# Patient Record
Sex: Male | Born: 1950
Health system: Southern US, Community
[De-identification: ages and names within clinical notes are randomized; demographics above are authoritative.]

## PROBLEM LIST (undated history)

## (undated) DIAGNOSIS — E7800 Pure hypercholesterolemia, unspecified: Secondary | ICD-10-CM

## (undated) DIAGNOSIS — E079 Disorder of thyroid, unspecified: Secondary | ICD-10-CM

## (undated) DIAGNOSIS — I10 Essential (primary) hypertension: Secondary | ICD-10-CM

## (undated) DIAGNOSIS — I2510 Atherosclerotic heart disease of native coronary artery without angina pectoris: Secondary | ICD-10-CM

## (undated) HISTORY — PX: CYST EXCISION: SHX5701

## (undated) HISTORY — PX: BIOPSY THYROID: PRO38

## (undated) HISTORY — PX: WISDOM TOOTH EXTRACTION: SHX21

## (undated) HISTORY — PX: OTHER SURGICAL HISTORY: SHX169

---

## 2002-06-05 ENCOUNTER — Ambulatory Visit (HOSPITAL_BASED_OUTPATIENT_CLINIC_OR_DEPARTMENT_OTHER): Admission: RE | Admit: 2002-06-05 | Discharge: 2002-06-05 | Payer: Self-pay | Admitting: General Surgery

## 2002-06-05 ENCOUNTER — Encounter (INDEPENDENT_AMBULATORY_CARE_PROVIDER_SITE_OTHER): Payer: Self-pay | Admitting: Specialist

## 2005-09-29 ENCOUNTER — Encounter: Admission: RE | Admit: 2005-09-29 | Discharge: 2005-09-29 | Payer: Self-pay | Admitting: Internal Medicine

## 2005-10-31 ENCOUNTER — Ambulatory Visit (HOSPITAL_BASED_OUTPATIENT_CLINIC_OR_DEPARTMENT_OTHER): Admission: RE | Admit: 2005-10-31 | Discharge: 2005-10-31 | Payer: Self-pay | Admitting: Plastic Surgery

## 2005-10-31 ENCOUNTER — Encounter (INDEPENDENT_AMBULATORY_CARE_PROVIDER_SITE_OTHER): Payer: Self-pay | Admitting: *Deleted

## 2006-10-02 ENCOUNTER — Encounter: Admission: RE | Admit: 2006-10-02 | Discharge: 2006-10-02 | Payer: Self-pay | Admitting: Internal Medicine

## 2008-10-05 ENCOUNTER — Encounter: Admission: RE | Admit: 2008-10-05 | Discharge: 2008-10-05 | Payer: Self-pay | Admitting: Internal Medicine

## 2009-12-13 ENCOUNTER — Encounter: Admission: RE | Admit: 2009-12-13 | Discharge: 2009-12-13 | Payer: Self-pay | Admitting: Endocrinology

## 2010-12-30 NOTE — Op Note (Signed)
   NAME:  Chase Sullivan, Chase Sullivan                         ACCOUNT NO.:  [id number]   MEDICAL RECORD NO.:  [id number]                   PATIENT TYPE:  AMB   LOCATION:  DSC                                  FACILITY:  [facility]   PHYSICIAN:  Rose Phi. Maple Hudson, M.D.                DATE OF BIRTH:  May 19, 1951   DATE OF PROCEDURE:  06/05/2002  DATE OF DISCHARGE:                                 OPERATIVE REPORT   PREOPERATIVE DIAGNOSIS:  Two large sebaceous cysts of his back.   POSTOPERATIVE DIAGNOSIS:  Two large sebaceous cysts of his back.   PROCEDURE:  Excision of two large sebaceous cysts of his back.   SURGEON:  Rose Phi. Maple Hudson, M.D.   ANESTHESIA:  MAC.   DESCRIPTION OF PROCEDURE:  Patient placed in the prone position and the back  prepped and draped in the usual fashion.  One cyst measured about 5 cm in  diameter and the other about 2 cm.  After making vertical incisions and  prepping and draping, we thoroughly infiltrated the area with 1% Xylocaine  with adrenalin.  Incisions were made, and both cysts were excised intact.  The larger incision left a defect of about 5 x 2 cm and the smaller one  about 2 x 1 cm.  They were both closed with interrupted nylon sutures.  Dressings applied.  The patient transferred to the recovery room in  satisfactory condition, having tolerated the procedure well.                                               Rose Phi. Maple Hudson, M.D.    PRY/MEDQ  D:  06/05/2002  T:  06/05/2002  Job:  [id number]

## 2010-12-30 NOTE — Op Note (Signed)
NAME:  Chase Sullivan, Chase Sullivan NO.:  [id number]   MEDICAL RECORD NO.:  [id number]          PATIENT TYPE:  AMB   LOCATION:  DSC                          FACILITY:  [facility]   PHYSICIAN:  Etter Sjogren, M.D.     DATE OF BIRTH:  [birth year]   DATE OF PROCEDURE:  10/31/2005  DATE OF DISCHARGE:                                 OPERATIVE REPORT   PREOPERATIVE DIAGNOSIS:  Lesion, left lower eyelid, undetermined behavior.   POSTOPERATIVE DIAGNOSIS:  Lesion, left lower eyelid, undetermined behavior.   OPERATION PERFORMED:  Excision lesion, left lower eyelid.   SURGEON:  Etter Sjogren, M.D.   ANESTHESIA:  MAC with local 1% Xylocaine with epinephrine plus bicarb.   INDICATIONS FOR PROCEDURE:  The patient is a 59 year old man with lesion  left lower eyelid.  He is referred by his internist for evaluation and  excision.  Ashby Dawes of the procedure and the risks were discussed with the  patient.  He was very nervous and preferred to use sedation anesthetic for  this.  He understood the procedure, scarring and wished to proceed.   DESCRIPTION OF PROCEDURE:  The patient was taken to the operating room and  placed supine.  He was prepped with Betadine and draped with sterile drapes.  The lesion was marked for an elliptical  excision and satisfactory local  anesthesia was achieved.  The excision was performed.  The wound irrigated  thoroughly and meticulous hemostasis electrocautery.  Closure with 6-0  Prolene simple running suture.  Tolerated well.   DISPOSITION:  Recheck in the office next week.      Etter Sjogren, M.D.  Electronically Signed     DB/MEDQ  D:  10/31/2005  T:  11/01/2005  Job:  [id number]

## 2012-02-27 ENCOUNTER — Other Ambulatory Visit: Payer: Self-pay | Admitting: Internal Medicine

## 2012-02-27 DIAGNOSIS — E049 Nontoxic goiter, unspecified: Secondary | ICD-10-CM

## 2012-03-04 ENCOUNTER — Other Ambulatory Visit: Payer: Self-pay

## 2012-04-01 ENCOUNTER — Ambulatory Visit
Admission: RE | Admit: 2012-04-01 | Discharge: 2012-04-01 | Disposition: A | Discharge status: Home / Self Care | Payer: BC Managed Care – PPO | Source: Ambulatory Visit | Attending: Internal Medicine | Admitting: Internal Medicine

## 2012-04-01 ENCOUNTER — Inpatient Hospital Stay: Admission: RE | Admit: 2012-04-01 | Payer: Self-pay | Source: Ambulatory Visit

## 2012-04-01 DIAGNOSIS — E049 Nontoxic goiter, unspecified: Secondary | ICD-10-CM

## 2012-10-23 ENCOUNTER — Other Ambulatory Visit: Payer: Self-pay | Admitting: Endocrinology

## 2013-02-24 ENCOUNTER — Ambulatory Visit
Admission: RE | Admit: 2013-02-24 | Discharge: 2013-02-24 | Disposition: A | Discharge status: Home / Self Care | Payer: BC Managed Care – PPO | Source: Ambulatory Visit | Attending: Endocrinology | Admitting: Endocrinology

## 2013-02-24 DIAGNOSIS — E049 Nontoxic goiter, unspecified: Secondary | ICD-10-CM

## 2014-04-28 ENCOUNTER — Other Ambulatory Visit: Payer: Self-pay | Admitting: Endocrinology

## 2014-04-28 DIAGNOSIS — E049 Nontoxic goiter, unspecified: Secondary | ICD-10-CM

## 2014-05-06 ENCOUNTER — Ambulatory Visit
Admission: RE | Admit: 2014-05-06 | Discharge: 2014-05-06 | Disposition: A | Discharge status: Home / Self Care | Payer: BC Managed Care – PPO | Source: Ambulatory Visit | Attending: Endocrinology | Admitting: Endocrinology

## 2014-05-06 DIAGNOSIS — E049 Nontoxic goiter, unspecified: Secondary | ICD-10-CM

## 2015-04-26 ENCOUNTER — Other Ambulatory Visit: Payer: Self-pay | Admitting: Endocrinology

## 2015-04-26 DIAGNOSIS — E049 Nontoxic goiter, unspecified: Secondary | ICD-10-CM

## 2016-04-26 ENCOUNTER — Ambulatory Visit
Admission: RE | Admit: 2016-04-26 | Discharge: 2016-04-26 | Disposition: A | Discharge status: Home / Self Care | Payer: Self-pay | Source: Ambulatory Visit | Attending: Endocrinology | Admitting: Endocrinology

## 2016-04-26 DIAGNOSIS — E049 Nontoxic goiter, unspecified: Secondary | ICD-10-CM

## 2017-03-18 ENCOUNTER — Other Ambulatory Visit: Payer: Self-pay

## 2017-03-18 ENCOUNTER — Emergency Department (HOSPITAL_BASED_OUTPATIENT_CLINIC_OR_DEPARTMENT_OTHER)
Admission: EM | Admit: 2017-03-18 | Discharge: 2017-03-18 | Disposition: A | Discharge status: Home / Self Care | Payer: BLUE CROSS/BLUE SHIELD | Attending: Emergency Medicine | Admitting: Emergency Medicine

## 2017-03-18 ENCOUNTER — Encounter (HOSPITAL_BASED_OUTPATIENT_CLINIC_OR_DEPARTMENT_OTHER): Payer: Self-pay | Admitting: *Deleted

## 2017-03-18 DIAGNOSIS — I10 Essential (primary) hypertension: Secondary | ICD-10-CM | POA: Diagnosis not present

## 2017-03-18 DIAGNOSIS — E079 Disorder of thyroid, unspecified: Secondary | ICD-10-CM | POA: Insufficient documentation

## 2017-03-18 DIAGNOSIS — R42 Dizziness and giddiness: Secondary | ICD-10-CM | POA: Diagnosis present

## 2017-03-18 DIAGNOSIS — I2510 Atherosclerotic heart disease of native coronary artery without angina pectoris: Secondary | ICD-10-CM | POA: Diagnosis not present

## 2017-03-18 DIAGNOSIS — R739 Hyperglycemia, unspecified: Secondary | ICD-10-CM

## 2017-03-18 DIAGNOSIS — Z79899 Other long term (current) drug therapy: Secondary | ICD-10-CM | POA: Diagnosis not present

## 2017-03-18 DIAGNOSIS — R0789 Other chest pain: Secondary | ICD-10-CM | POA: Diagnosis not present

## 2017-03-18 HISTORY — DX: Disorder of thyroid, unspecified: E07.9

## 2017-03-18 HISTORY — DX: Atherosclerotic heart disease of native coronary artery without angina pectoris: I25.10

## 2017-03-18 HISTORY — DX: Essential (primary) hypertension: I10

## 2017-03-18 HISTORY — DX: Pure hypercholesterolemia, unspecified: E78.00

## 2017-03-18 LAB — BASIC METABOLIC PANEL
Anion gap: 8 (ref 5–15)
BUN: 17 mg/dL (ref 6–20)
CHLORIDE: 98 mmol/L — AB (ref 101–111)
CO2: 28 mmol/L (ref 22–32)
Calcium: 9.3 mg/dL (ref 8.9–10.3)
Creatinine, Ser: 1.04 mg/dL (ref 0.61–1.24)
GFR calc non Af Amer: >60 mL/min (ref >60)
Glucose, Bld: 218 mg/dL — ABNORMAL HIGH (ref 65–99)
POTASSIUM: 3.3 mmol/L — AB (ref 3.5–5.1)
SODIUM: 134 mmol/L — AB (ref 135–145)

## 2017-03-18 LAB — CBC WITH DIFFERENTIAL/PLATELET
BASOS ABS: 0 10*3/uL (ref 0.0–0.1)
Basophils Relative: 0 %
Eosinophils Absolute: 0 10*3/uL (ref 0.0–0.7)
Eosinophils Relative: 0 %
HEMATOCRIT: 43.2 % (ref 39.0–52.0)
Hemoglobin: 14.7 g/dL (ref 13.0–17.0)
LYMPHS PCT: 5 %
Lymphs Abs: 0.6 10*3/uL — ABNORMAL LOW (ref 0.7–4.0)
MCH: 29.1 pg (ref 26.0–34.0)
MCHC: 34 g/dL (ref 30.0–36.0)
MCV: 85.4 fL (ref 78.0–100.0)
Monocytes Absolute: 0.5 10*3/uL (ref 0.1–1.0)
Monocytes Relative: 5 %
NEUTROS ABS: 9.3 10*3/uL — AB (ref 1.7–7.7)
Neutrophils Relative %: 90 %
Platelets: 154 10*3/uL (ref 150–400)
RBC: 5.06 MIL/uL (ref 4.22–5.81)
RDW: 13.3 % (ref 11.5–15.5)
WBC: 10.4 10*3/uL (ref 4.0–10.5)

## 2017-03-18 LAB — TROPONIN I

## 2017-03-18 LAB — TSH: TSH: 2.267 u[IU]/mL (ref 0.350–4.500)

## 2017-03-18 MED ORDER — MECLIZINE HCL 25 MG PO TABS
25.0000 mg | ORAL_TABLET | Freq: Once | ORAL | Status: AC
  Administered 2017-03-18: 25 mg via ORAL
  Filled 2017-03-18: qty 1

## 2017-03-18 MED ORDER — MECLIZINE HCL 25 MG PO TABS: 25.0000 mg | ORAL_TABLET | Freq: Three times a day (TID) | ORAL | 0 refills | Status: AC | PRN

## 2017-03-18 NOTE — Discharge Instructions (Signed)
Meclizine as prescribed as needed for dizziness.  Follow-up with your primary Dr. if not improving in the next week, and return to the ER if symptoms significantly worsen or change.

## 2017-03-18 NOTE — ED Provider Notes (Addendum)
[facility]   CSN: [id number] Arrival date & time: 03/18/17  1042     History   Chief Complaint Chief Complaint  Patient presents with  . Dizziness  . Chest Pain    HPI Mickeal Skinnererry L Pfeifle is a 66 y.o. male.  Patient is a 66 year old male with past medical history of hypertension and hypercholesterolemia. He presents today for evaluation of dizziness. He woke up this morning with a spinning sensation that is worse when he lies flat or sits upright. He denies any visual disturbances. He denies any weakness or numbness. He does report chest discomfort to the left upper anterior chest wall but denies any shortness of breath or diaphoresis. He does feel nauseated when his dizzy sensation occurs. He reports some ringing in his ears which has been chronic, but no loss of hearing.   The history is provided by the patient.  Dizziness  Quality:  Head spinning and imbalance Severity:  Moderate Onset quality:  Sudden Duration:  3 hours Timing:  Intermittent Progression:  Unchanged Chronicity:  New Context: not when bending over and not with head movement   Relieved by:  Nothing Worsened by:  Lying down and sitting upright Ineffective treatments:  None tried Associated symptoms: chest pain   Associated symptoms: no shortness of breath and no vomiting     Past Medical History:  Diagnosis Date  . Coronary artery disease   . Elevated cholesterol   . Hypertension   . Thyroid disease    goiter    There are no active problems to display for this patient.   Past Surgical History:  Procedure Laterality Date  . BIOPSY THYROID    . colonscopy    . CYST EXCISION     face and back  . WISDOM TOOTH EXTRACTION         Home Medications    Prior to Admission medications   Medication Sig Start Date End Date Taking? Authorizing Provider  aspirin 81 MG chewable tablet Chew by mouth daily.   Yes [provider]  atenolol-chlorthalidone (TENORETIC)  50-25 MG tablet Take 1 tablet by mouth daily.   Yes [provider]  atorvastatin (LIPITOR) 10 MG tablet Take 20 mg by mouth daily.   Yes [provider]  calcium citrate-vitamin D (CITRACAL+D) 315-200 MG-UNIT tablet Take 1 tablet by mouth 2 (two) times daily.   Yes [provider]  Multiple Vitamin (MULTIVITAMIN) capsule Take 1 capsule by mouth daily.   Yes [provider]  vitamin A 7500 UNIT capsule Take 1,000 Units by mouth daily.   Yes [provider]    Family History No family history on file.  Social History Social History  Substance Use Topics  . Smoking status: Never Smoker  . Smokeless tobacco: Never Used  . Alcohol use No     Allergies   Codeine   Review of Systems Review of Systems  Respiratory: Negative for shortness of breath.   Cardiovascular: Positive for chest pain.  Gastrointestinal: Negative for vomiting.  Neurological: Positive for dizziness.  All other systems reviewed and are negative.    Physical Exam Updated Vital Signs BP 140/79 (BP Location: Left Arm)   Pulse 68   Temp 98 F (36.7 C) (Oral)   Resp 18   Ht 5\' 10"  (1.778 m)   Wt 93 kg (205 lb)   SpO2 98%   BMI 29.41 kg/m   Physical Exam  Constitutional: He is oriented to person, place, and time.  He appears well-developed and well-nourished. No distress.  HENT:  Head: Normocephalic and atraumatic.  Mouth/Throat: Oropharynx is clear and moist.  TMs are clear bilaterally.  Eyes: Pupils are equal, round, and reactive to light. EOM are normal.  Neck: Normal range of motion. Neck supple.  Cardiovascular: Normal rate and regular rhythm.  Exam reveals no friction rub.   No murmur heard. Pulmonary/Chest: Effort normal and breath sounds normal. No respiratory distress. He has no wheezes. He has no rales.  Abdominal: Soft. Bowel sounds are normal. He exhibits no distension. There is no tenderness.  Musculoskeletal: Normal range of motion. He exhibits  no edema.  Neurological: He is alert and oriented to person, place, and time. No cranial nerve deficit. He exhibits normal muscle tone. Coordination normal.  Skin: Skin is warm and dry. He is not diaphoretic.  Nursing note and vitals reviewed.    ED Treatments / Results  Labs (all labs ordered are listed, but only abnormal results are displayed) Labs Reviewed  BASIC METABOLIC PANEL  TROPONIN I  CBC WITH DIFFERENTIAL/PLATELET  TSH    EKG  EKG Interpretation None       Radiology No results found.  Procedures Procedures (including critical care time)  Medications Ordered in ED Medications  meclizine (ANTIVERT) tablet 25 mg (not administered)     Initial Impression / Assessment and Plan / ED Course  I have reviewed the triage vital signs and the nursing notes.  Pertinent labs & imaging results that were available during my care of the patient were reviewed by me and considered in my medical decision making (see chart for details).  Patient presents here with complaints of dizziness that is worse when he is forward and lies back. It is very positional in nature and I suspect his related to a peripheral vertigo. He was given meclizine with good improvement. His EKG, troponin, and laboratory studies are all unremarkable.  He will be treated with meclizine and is to follow-up with his primary Dr. if he is not improving and return if he worsens.  He did have an elevated blood sugar of 218. I had a discussion with him regarding this and have advised him to follow-up with his doctor for a recheck and possible medication.  Final Clinical Impressions(s) / ED Diagnoses   Final diagnoses:  None    New Prescriptions New Prescriptions   No medications on file     Geoffery Lyonselo, Israella Hubert, MD 03/18/17 1318    Geoffery Lyonselo, Diantha Paxson, MD 03/18/17 1323

## 2017-03-18 NOTE — ED Triage Notes (Signed)
Patient states when he got out of bed today at 0730 he was dizzy.  States the dizziness intensity changes due to position changes of his head.  States he has a chronic pinpoint discomfort in the left upper chest for several years, which comes and goes and usually lasts from a few minutes to a few hours, may go months without the discomfort. States he did have some sweating and nausea earlier today which the chest discomfort and dizziness.

## 2017-03-18 NOTE — ED Notes (Signed)
Family/pt inquiring about Chest XR. Dr. Judd Lienelo informed, no new orders at this time.

## 2017-03-18 NOTE — ED Notes (Signed)
ED Provider at bedside. 

## 2018-05-31 DIAGNOSIS — Z23 Encounter for immunization: Secondary | ICD-10-CM | POA: Diagnosis not present

## 2018-06-28 DIAGNOSIS — Z0000 Encounter for general adult medical examination without abnormal findings: Secondary | ICD-10-CM | POA: Diagnosis not present

## 2018-06-28 DIAGNOSIS — I10 Essential (primary) hypertension: Secondary | ICD-10-CM | POA: Diagnosis not present

## 2018-06-28 DIAGNOSIS — Z125 Encounter for screening for malignant neoplasm of prostate: Secondary | ICD-10-CM | POA: Diagnosis not present

## 2018-06-28 DIAGNOSIS — E049 Nontoxic goiter, unspecified: Secondary | ICD-10-CM | POA: Diagnosis not present

## 2018-06-28 DIAGNOSIS — Z1159 Encounter for screening for other viral diseases: Secondary | ICD-10-CM | POA: Diagnosis not present

## 2018-07-03 DIAGNOSIS — I10 Essential (primary) hypertension: Secondary | ICD-10-CM | POA: Diagnosis not present

## 2018-07-03 DIAGNOSIS — E7800 Pure hypercholesterolemia, unspecified: Secondary | ICD-10-CM | POA: Diagnosis not present

## 2018-07-03 DIAGNOSIS — Z23 Encounter for immunization: Secondary | ICD-10-CM | POA: Diagnosis not present

## 2018-07-03 DIAGNOSIS — Z833 Family history of diabetes mellitus: Secondary | ICD-10-CM | POA: Diagnosis not present

## 2018-07-03 DIAGNOSIS — K5730 Diverticulosis of large intestine without perforation or abscess without bleeding: Secondary | ICD-10-CM | POA: Diagnosis not present

## 2018-07-03 DIAGNOSIS — Z8249 Family history of ischemic heart disease and other diseases of the circulatory system: Secondary | ICD-10-CM | POA: Diagnosis not present

## 2018-07-03 DIAGNOSIS — E291 Testicular hypofunction: Secondary | ICD-10-CM | POA: Diagnosis not present

## 2018-07-03 DIAGNOSIS — E049 Nontoxic goiter, unspecified: Secondary | ICD-10-CM | POA: Diagnosis not present

## 2018-07-03 DIAGNOSIS — R7303 Prediabetes: Secondary | ICD-10-CM | POA: Diagnosis not present

## 2018-07-03 DIAGNOSIS — Z6830 Body mass index (BMI) 30.0-30.9, adult: Secondary | ICD-10-CM | POA: Diagnosis not present

## 2018-07-03 DIAGNOSIS — Z7982 Long term (current) use of aspirin: Secondary | ICD-10-CM | POA: Diagnosis not present

## 2018-10-03 DIAGNOSIS — E049 Nontoxic goiter, unspecified: Secondary | ICD-10-CM | POA: Diagnosis not present

## 2018-10-08 ENCOUNTER — Other Ambulatory Visit: Payer: Self-pay | Admitting: Endocrinology

## 2018-10-08 DIAGNOSIS — E049 Nontoxic goiter, unspecified: Secondary | ICD-10-CM

## 2018-10-16 ENCOUNTER — Ambulatory Visit
Admission: RE | Admit: 2018-10-16 | Discharge: 2018-10-16 | Disposition: A | Discharge status: Home / Self Care | Payer: Medicare Other | Source: Ambulatory Visit | Attending: Endocrinology | Admitting: Endocrinology

## 2018-10-16 DIAGNOSIS — E049 Nontoxic goiter, unspecified: Secondary | ICD-10-CM

## 2018-12-18 DIAGNOSIS — M10272 Drug-induced gout, left ankle and foot: Secondary | ICD-10-CM | POA: Diagnosis not present

## 2018-12-18 DIAGNOSIS — I10 Essential (primary) hypertension: Secondary | ICD-10-CM | POA: Diagnosis not present

## 2018-12-30 DIAGNOSIS — I10 Essential (primary) hypertension: Secondary | ICD-10-CM | POA: Diagnosis not present

## 2018-12-30 DIAGNOSIS — M10279 Drug-induced gout, unspecified ankle and foot: Secondary | ICD-10-CM | POA: Diagnosis not present

## 2019-01-21 DIAGNOSIS — M10279 Drug-induced gout, unspecified ankle and foot: Secondary | ICD-10-CM | POA: Diagnosis not present

## 2019-01-21 DIAGNOSIS — I10 Essential (primary) hypertension: Secondary | ICD-10-CM | POA: Diagnosis not present

## 2019-04-25 DIAGNOSIS — Z23 Encounter for immunization: Secondary | ICD-10-CM | POA: Diagnosis not present

## 2019-07-03 DIAGNOSIS — E7800 Pure hypercholesterolemia, unspecified: Secondary | ICD-10-CM | POA: Diagnosis not present

## 2019-07-03 DIAGNOSIS — Z125 Encounter for screening for malignant neoplasm of prostate: Secondary | ICD-10-CM | POA: Diagnosis not present

## 2019-07-03 DIAGNOSIS — I10 Essential (primary) hypertension: Secondary | ICD-10-CM | POA: Diagnosis not present

## 2019-07-15 DIAGNOSIS — Z23 Encounter for immunization: Secondary | ICD-10-CM | POA: Diagnosis not present

## 2019-07-15 DIAGNOSIS — Z0000 Encounter for general adult medical examination without abnormal findings: Secondary | ICD-10-CM | POA: Diagnosis not present

## 2019-07-15 DIAGNOSIS — Z6830 Body mass index (BMI) 30.0-30.9, adult: Secondary | ICD-10-CM | POA: Diagnosis not present

## 2019-07-15 DIAGNOSIS — Z8739 Personal history of other diseases of the musculoskeletal system and connective tissue: Secondary | ICD-10-CM | POA: Diagnosis not present

## 2019-07-15 DIAGNOSIS — Z1212 Encounter for screening for malignant neoplasm of rectum: Secondary | ICD-10-CM | POA: Diagnosis not present

## 2019-07-15 DIAGNOSIS — E049 Nontoxic goiter, unspecified: Secondary | ICD-10-CM | POA: Diagnosis not present

## 2019-07-15 DIAGNOSIS — I10 Essential (primary) hypertension: Secondary | ICD-10-CM | POA: Diagnosis not present

## 2019-07-15 DIAGNOSIS — Z7982 Long term (current) use of aspirin: Secondary | ICD-10-CM | POA: Diagnosis not present

## 2019-07-15 DIAGNOSIS — E7800 Pure hypercholesterolemia, unspecified: Secondary | ICD-10-CM | POA: Diagnosis not present

## 2020-04-26 DIAGNOSIS — Z23 Encounter for immunization: Secondary | ICD-10-CM | POA: Diagnosis not present

## 2020-07-15 DIAGNOSIS — I10 Essential (primary) hypertension: Secondary | ICD-10-CM | POA: Diagnosis not present

## 2020-07-15 DIAGNOSIS — Z125 Encounter for screening for malignant neoplasm of prostate: Secondary | ICD-10-CM | POA: Diagnosis not present

## 2020-07-20 DIAGNOSIS — I10 Essential (primary) hypertension: Secondary | ICD-10-CM | POA: Diagnosis not present

## 2020-07-20 DIAGNOSIS — Z8739 Personal history of other diseases of the musculoskeletal system and connective tissue: Secondary | ICD-10-CM | POA: Diagnosis not present

## 2020-07-20 DIAGNOSIS — E7800 Pure hypercholesterolemia, unspecified: Secondary | ICD-10-CM | POA: Diagnosis not present

## 2020-07-20 DIAGNOSIS — R7303 Prediabetes: Secondary | ICD-10-CM | POA: Diagnosis not present

## 2020-07-20 DIAGNOSIS — Z0000 Encounter for general adult medical examination without abnormal findings: Secondary | ICD-10-CM | POA: Diagnosis not present

## 2020-07-20 DIAGNOSIS — Z1212 Encounter for screening for malignant neoplasm of rectum: Secondary | ICD-10-CM | POA: Diagnosis not present

## 2020-07-20 DIAGNOSIS — M25561 Pain in right knee: Secondary | ICD-10-CM | POA: Diagnosis not present

## 2020-07-28 NOTE — Progress Notes (Signed)
° °  I, Christoper Fabian, LAT, ATC, am serving as scribe for Dr. Clementeen Graham.   Subjective:    CC: R knee pain  HPI: Pt is a 69yo male c/o R knee pain since Sept 2021 w/ no known MOI.  Pt locates pain to his R ant knee just inferior to his patella.  He denies any injury.  R knee swelling: No R knee mechanical symptoms: No Aggravates: R knee AROM into both flexion and extension; stairs; squatting; crossing R ankle over L knee Rx tried: OTC pain relievers/anti-inflammatories   Pertinent review of Systems: No fevers or chills  Relevant historical information: History of gout   Objective:    Vitals:   08/02/20 1332  BP: 112/78  Pulse: 60  SpO2: 96%   General: Well Developed, well nourished, and in no acute distress.   MSK: Right knee largely normal-appearing Normal motion with mild crepitation. Tender palpation medial joint line mildly Intact strength. Stable ligamentous exam..  Lab and Radiology Results X-ray images right knee obtained today personally and independently interpreted Mild DJD.  No acute fractures. Await for radiology review  Diagnostic Limited MSK Ultrasound of: Right knee Quad tendon intact.  Hyperechoic change distal tendon insertion at superior patellar pole consistent with chronic calcific tendinopathy. Patellar tendon normal-appearing Small amount of hypoechoic fluid tracking within subcutaneous tissue superior to patellar tendon consistent with prepatellar bursitis. Medial joint line narrowed degenerative appearing with partial extruded medial meniscus. Lateral joint line normal-appearing Posterior knee no Baker's cyst. Impression: Prepatellar bursitis and medial compartment DJD    Impression and Recommendations:    Assessment and Plan: 69 y.o. male with right knee pain due to DJD with probable medial meniscus tear and probable prepatellar bursitis.  Plan to treat with conservative management initially with Voltaren gel and home exercise program  focused for prepatellar bursitis.  Recheck in 1 to 2 months if not improved would consider trial of steroid injection.  Patient declined offered injection today.Marland Kitchen  PDMP not reviewed this encounter. Orders Placed This Encounter  Procedures   [place] LIMITED JOINT SPACE STRUCTURES LOW RIGHT(NO LINKED CHARGES)    Order Specific Question:   Reason for Exam (SYMPTOM  OR DIAGNOSIS REQUIRED)    Answer:   R knee pain    Order Specific Question:   Preferred imaging location?    Answer:   [facility] Knee AP/LAT W/[place] Right    Standing Status:   Future    Number of Occurrences:   1    Standing Expiration Date:   09/02/2020    Order Specific Question:   Reason for Exam (SYMPTOM  OR DIAGNOSIS REQUIRED)    Answer:   R knee pain    Order Specific Question:   Preferred imaging location?    Answer:   [facility]   No orders of the defined types were placed in this encounter.   Discussed warning signs or symptoms. Please see discharge instructions. Patient expresses understanding.   The above documentation has been reviewed and is accurate and complete Clementeen Graham, M.D.

## 2020-08-02 ENCOUNTER — Ambulatory Visit (INDEPENDENT_AMBULATORY_CARE_PROVIDER_SITE_OTHER): Payer: Medicare Other | Admitting: Family Medicine

## 2020-08-02 ENCOUNTER — Other Ambulatory Visit: Payer: Self-pay

## 2020-08-02 ENCOUNTER — Ambulatory Visit (INDEPENDENT_AMBULATORY_CARE_PROVIDER_SITE_OTHER): Payer: Medicare Other

## 2020-08-02 ENCOUNTER — Ambulatory Visit: Payer: Self-pay

## 2020-08-02 ENCOUNTER — Encounter: Payer: Self-pay | Admitting: Family Medicine

## 2020-08-02 VITALS — BP 112/78 | HR 60 | Ht 70.0 in | Wt 221.2 lb

## 2020-08-02 DIAGNOSIS — Z23 Encounter for immunization: Secondary | ICD-10-CM | POA: Diagnosis not present

## 2020-08-02 DIAGNOSIS — M25561 Pain in right knee: Secondary | ICD-10-CM | POA: Diagnosis not present

## 2020-08-02 NOTE — Patient Instructions (Signed)
Thanks for coming in today.  Please get an X-ray on your way out.  Please perform the exercise program that we have prepared for you and gone over in detail on a daily basis.  In addition to the handout you were provided you can access your program through: [email or web address]   Your unique program code is:  [email or web address]  Please use voltaren gel up to 4x daily for pain as needed.   Please follow-up in 6-8 weeks.

## 2020-08-03 NOTE — Progress Notes (Signed)
X-ray right knee looks pretty normal to radiology.

## 2020-09-14 NOTE — Progress Notes (Signed)
   I, Philbert Riser, LAT, ATC acting as a scribe for Clementeen Graham, MD.  Chase Sullivan is a 70 y.o. male who presents to [facility] today for f/u R anterior knee pain that's been ongoing since Sept 2021 w/ no known MOI. Pt was last seen by Dr. Denyse Amass on 08/02/20 and was advised to treat w/ Voltaren gel and HEP, as pt decline steroid injection. Today, pt reports R knee is better some days and worse other. Pt locates pain to under patella and along patellar tendon. Pt reports HEP is helping some, but no relief for Voltaren gel. Pt is concerned about overall inflammation as he does have some pain in L knee and bilat hips.  Swelling: no Mechanical symptoms: no Aggravates: transitioning from sitting to standing, flex/ext knee, stairs Rx tried: IBU, Acetaminaphen- w/ some relief, Voltaren gel  Dx imaging: 08/02/20 R knee XR  Pertinent review of systems: No fevers or chills  Relevant historical information: Hypercholesterolemia, hypertension   Exam:  BP 118/82 (BP Location: Right Arm, Patient Position: Sitting, Cuff Size: Normal)   Pulse 61   Ht 5\' 10"  (1.778 m)   Wt 219 lb 3.2 oz (99.4 kg)   SpO2 95%   BMI 31.45 kg/m  General: Well Developed, well nourished, and in no acute distress.   MSK: Right knee normal motion Normal gait.    Assessment and Plan: 70 y.o. male with right knee pain thought to be due to DJD and degenerative meniscus tear.  Patient is doing reasonably well with conservative management.  We discussed potential options.  He would like to continue to pursue conservative management including quad strengthening.    I discussed further options and stressed the importance of weight loss as well.  Recommended [place] healthy weight and wellness.  Discussed some dietary modification that may be helpful initially.  If needed he is a good candidate for steroid or hyaluronic acid injection in the future.  Check back with me as  needed.   Discussed warning signs or symptoms. Please see discharge instructions. Patient expresses understanding.   The above documentation has been reviewed and is accurate and complete 79, M.D.   Total encounter time 20 minutes including face-to-face time with the patient and, reviewing past medical record, and charting on the date of service.   Treatment plan and options.

## 2020-09-15 ENCOUNTER — Ambulatory Visit (INDEPENDENT_AMBULATORY_CARE_PROVIDER_SITE_OTHER): Payer: Medicare Other | Admitting: Family Medicine

## 2020-09-15 ENCOUNTER — Other Ambulatory Visit: Payer: Self-pay

## 2020-09-15 VITALS — BP 118/82 | HR 61 | Ht 70.0 in | Wt 219.2 lb

## 2020-09-15 DIAGNOSIS — E7800 Pure hypercholesterolemia, unspecified: Secondary | ICD-10-CM | POA: Insufficient documentation

## 2020-09-15 DIAGNOSIS — M25561 Pain in right knee: Secondary | ICD-10-CM | POA: Diagnosis not present

## 2020-09-15 DIAGNOSIS — Z8739 Personal history of other diseases of the musculoskeletal system and connective tissue: Secondary | ICD-10-CM | POA: Insufficient documentation

## 2020-09-15 DIAGNOSIS — R7303 Prediabetes: Secondary | ICD-10-CM | POA: Insufficient documentation

## 2020-09-15 DIAGNOSIS — Z8249 Family history of ischemic heart disease and other diseases of the circulatory system: Secondary | ICD-10-CM | POA: Insufficient documentation

## 2020-09-15 DIAGNOSIS — E049 Nontoxic goiter, unspecified: Secondary | ICD-10-CM | POA: Insufficient documentation

## 2020-09-15 NOTE — Patient Instructions (Signed)
Thank you for coming in today.  Please use voltaren gel up to 4x daily for pain as needed.   Weight loss will help.   Try to aim for 500 calories per day less than your needs.   [facility] can help.   Address: [address] [place], [place], [place] [address] Phone: [phone]  Let me know if you would like to do PT.

## 2021-06-09 DIAGNOSIS — Z23 Encounter for immunization: Secondary | ICD-10-CM | POA: Diagnosis not present

## 2021-07-20 DIAGNOSIS — R7309 Other abnormal glucose: Secondary | ICD-10-CM | POA: Diagnosis not present

## 2021-07-20 DIAGNOSIS — E049 Nontoxic goiter, unspecified: Secondary | ICD-10-CM | POA: Diagnosis not present

## 2021-07-20 DIAGNOSIS — I10 Essential (primary) hypertension: Secondary | ICD-10-CM | POA: Diagnosis not present

## 2021-07-20 DIAGNOSIS — Z0000 Encounter for general adult medical examination without abnormal findings: Secondary | ICD-10-CM | POA: Diagnosis not present

## 2021-07-20 DIAGNOSIS — E7800 Pure hypercholesterolemia, unspecified: Secondary | ICD-10-CM | POA: Diagnosis not present

## 2021-07-20 DIAGNOSIS — Z125 Encounter for screening for malignant neoplasm of prostate: Secondary | ICD-10-CM | POA: Diagnosis not present

## 2021-07-25 DIAGNOSIS — R7303 Prediabetes: Secondary | ICD-10-CM | POA: Diagnosis not present

## 2021-07-25 DIAGNOSIS — I10 Essential (primary) hypertension: Secondary | ICD-10-CM | POA: Diagnosis not present

## 2021-07-25 DIAGNOSIS — Z0000 Encounter for general adult medical examination without abnormal findings: Secondary | ICD-10-CM | POA: Diagnosis not present

## 2021-07-25 DIAGNOSIS — Z6831 Body mass index (BMI) 31.0-31.9, adult: Secondary | ICD-10-CM | POA: Diagnosis not present

## 2021-07-25 DIAGNOSIS — E7800 Pure hypercholesterolemia, unspecified: Secondary | ICD-10-CM | POA: Diagnosis not present

## 2021-07-25 DIAGNOSIS — Z8249 Family history of ischemic heart disease and other diseases of the circulatory system: Secondary | ICD-10-CM | POA: Diagnosis not present

## 2021-07-26 ENCOUNTER — Other Ambulatory Visit: Payer: Self-pay | Admitting: Internal Medicine

## 2021-07-26 DIAGNOSIS — E7800 Pure hypercholesterolemia, unspecified: Secondary | ICD-10-CM

## 2021-08-22 ENCOUNTER — Ambulatory Visit
Admission: RE | Admit: 2021-08-22 | Discharge: 2021-08-22 | Disposition: A | Discharge status: Home / Self Care | Payer: No Typology Code available for payment source | Source: Ambulatory Visit | Attending: Internal Medicine | Admitting: Internal Medicine

## 2021-08-22 DIAGNOSIS — E7800 Pure hypercholesterolemia, unspecified: Secondary | ICD-10-CM

## 2021-09-06 DIAGNOSIS — I2510 Atherosclerotic heart disease of native coronary artery without angina pectoris: Secondary | ICD-10-CM | POA: Diagnosis not present

## 2022-01-18 IMAGING — CT CT CARDIAC CORONARY ARTERY CALCIUM SCORE
3 series · 13 of 20 positions shown, 15 images · non-contrast
Comparison: None.

CLINICAL DATA: 70-year-old white male with hypercholesterolemia.

EXAM:
CT CARDIAC CORONARY ARTERY CALCIUM SCORE
TECHNIQUE: Non-contrast imaging through the heart was performed using
prospective ECG gating. Image post processing was performed on an
independent workstation, allowing for quantitative analysis of the
heart and coronary arteries. Note that this exam targets the heart
and the chest was not imaged in its entirety.

[Series 2: calcium scoring 2.00 qr36 bestdiast 71% hrt calciu · axial · 0.44mm/px · z∈[+1670,+1734]mm · 3 of 80 slices shown]
[im 16/80  vessel]
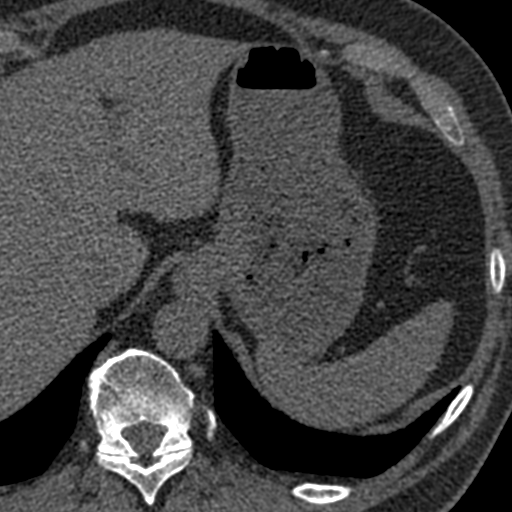
[im 32/80  vessel]
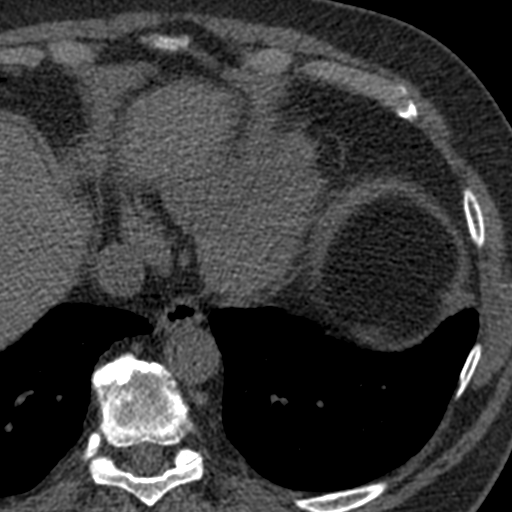
[im 48/80  vessel]
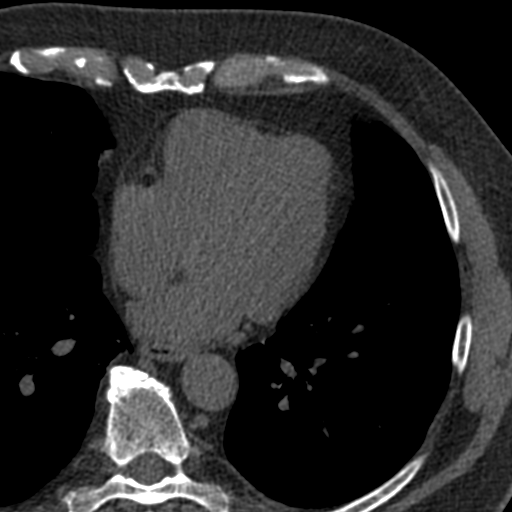

[Series 3: calcium scoring 2.00 br40 bestdiast 71% axial · axial · 0.53mm/px · z∈[+1666,+1770]mm · 5 of 80 slices shown, 7 images]
[im 14/80  vessel]
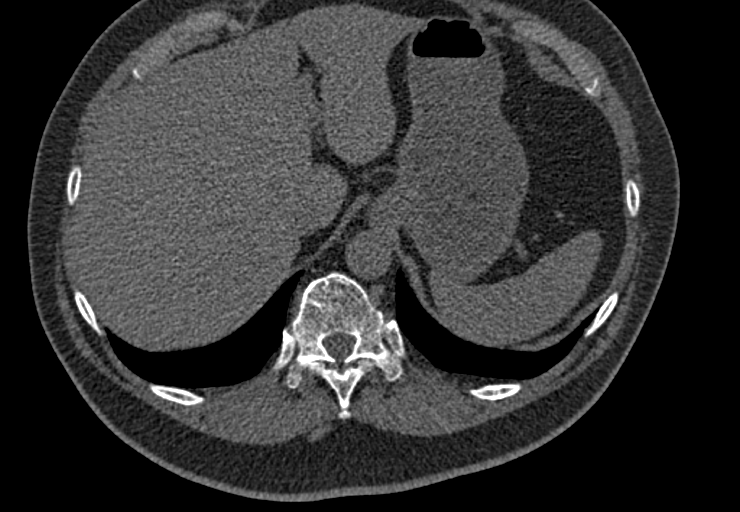
[im 14/80  lung]
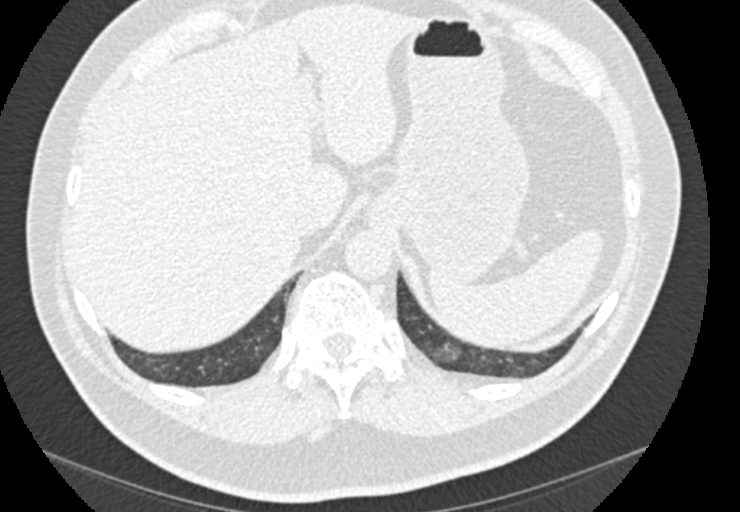
[im 27/80  vessel]
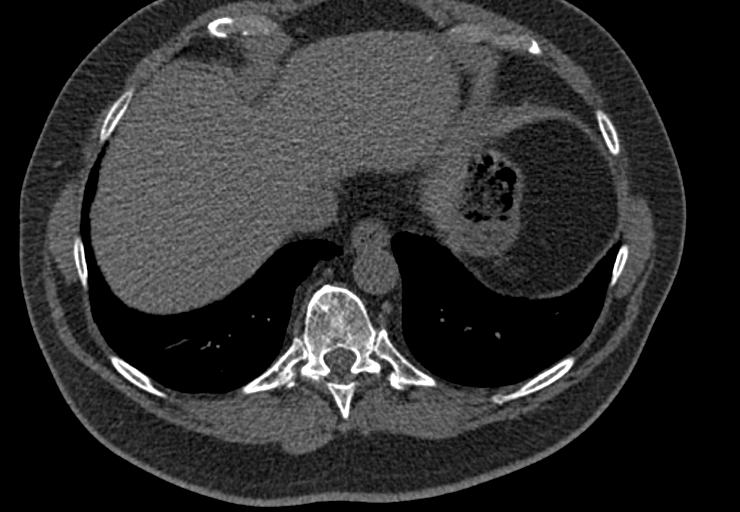
[im 40/80  vessel]
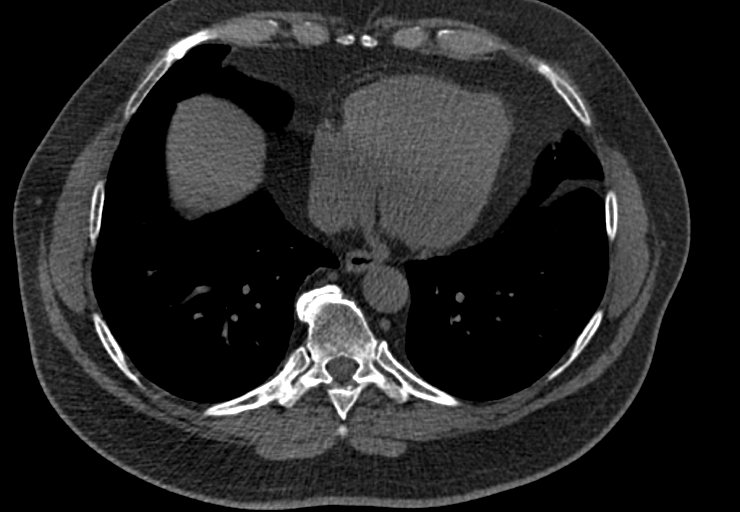
[im 53/80  vessel]
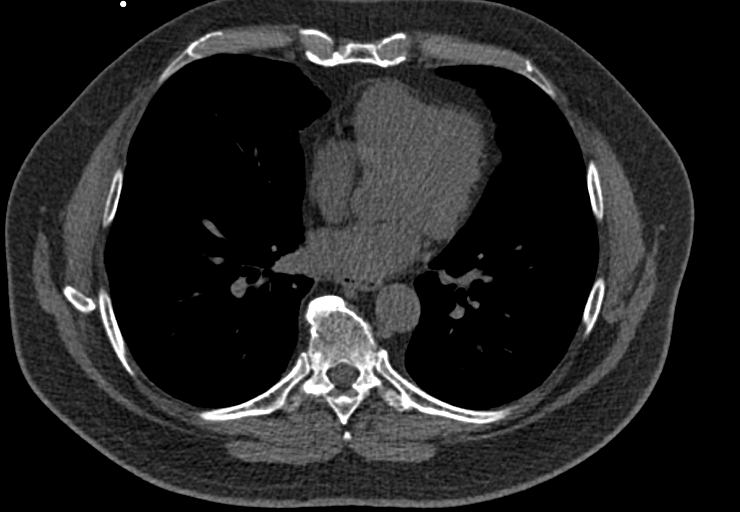
[im 66/80  vessel]
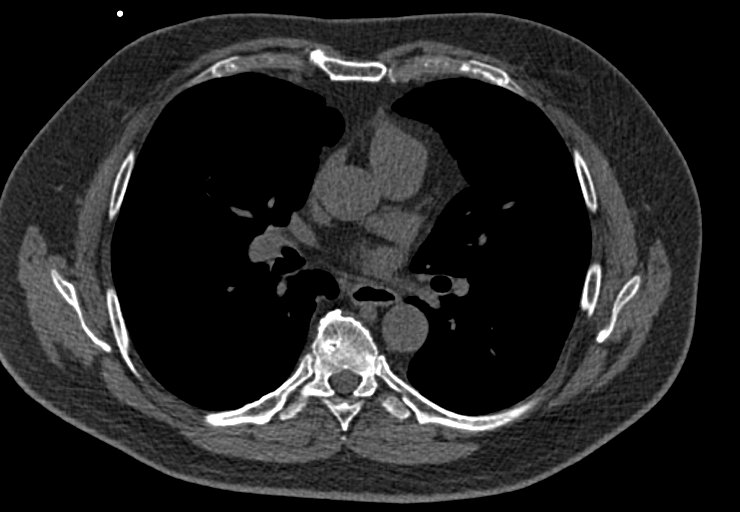
[im 66/80  lung]
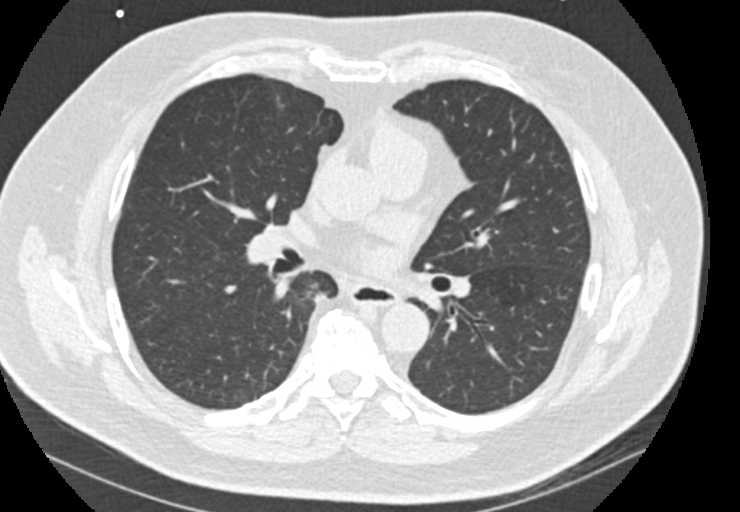

[Series 9: calcium scoring 2.00 br60 bestdiast 71% lungs · axial · 0.59mm/px · z∈[+1666,+1770]mm · 5 of 80 slices shown]
[im 14/80  vessel]
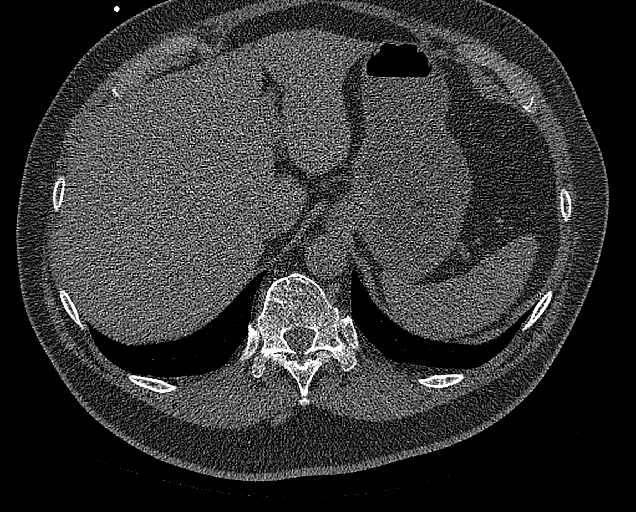
[im 27/80  vessel]
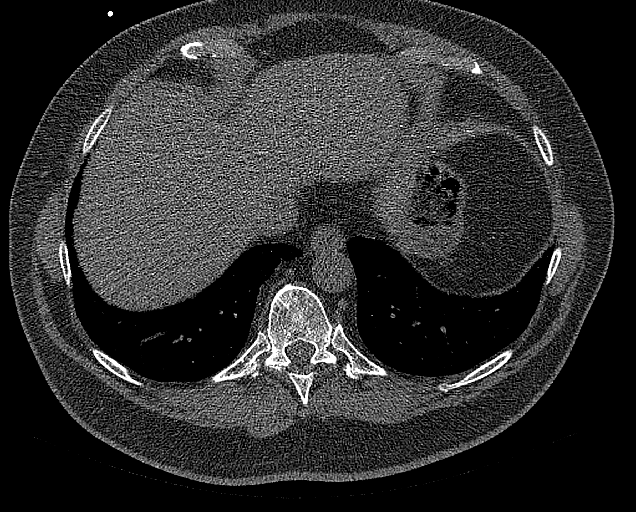
[im 40/80  vessel]
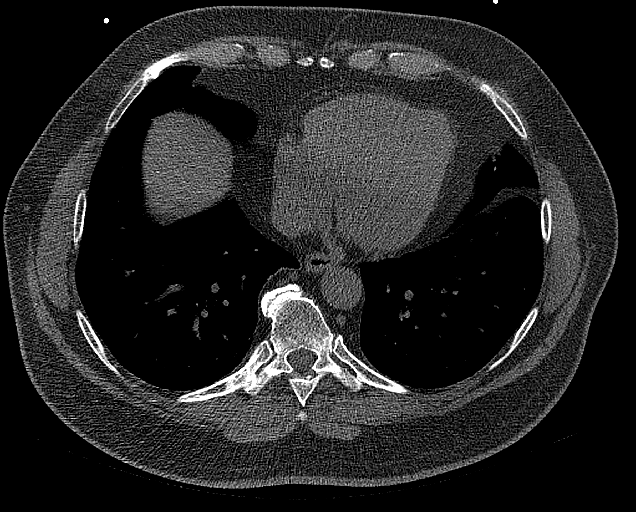
[im 53/80  vessel]
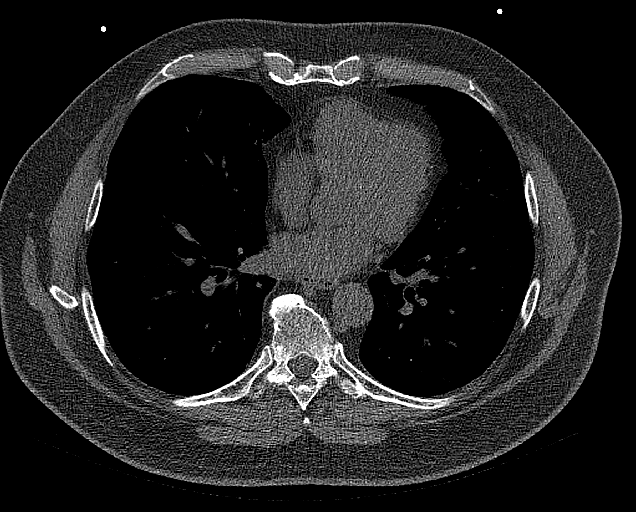
[im 66/80  vessel]
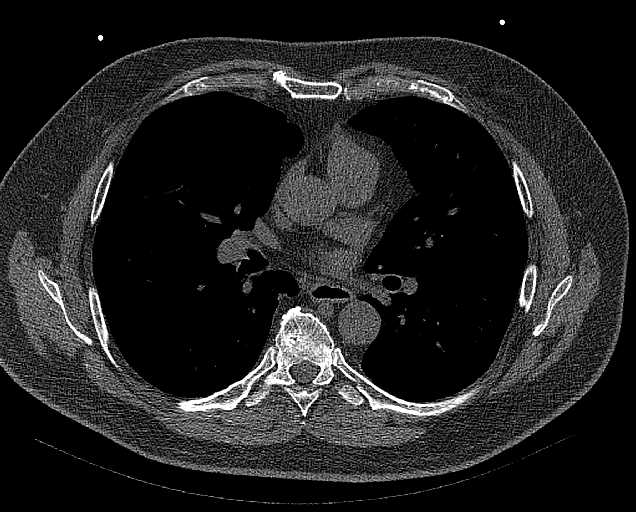

[13 of 20 positions shown; findings below may reference images not displayed]

FINDINGS: Technical quality: Good

CORONARY CALCIUM SCORES:

Left Main: No coronary artery calcification

LAD: 56

LCx:

RCA: No coronary calcification

CORONARY CALCIUM

Total Agatston Score:

[HOSPITAL] percentile: 39

Ascending aorta (normal <  40 mm): 29 mm

EXTRACARDIAC FINDINGS:

Limited view of the lung parenchyma demonstrates no suspicious
nodularity. Airways are normal.

Limited view of the mediastinum demonstrates no adenopathy.
Esophagus normal.

Limited view of the upper abdomen is unremarkable.

Limited view of the skeleton and chest wall is unremarkable.
IMPRESSION: 1. LAD and circumflex coronary artery calcification.

2. Total Agatston Score:

3. MESA age and sex matched database percentile: 29

## 2022-07-24 DIAGNOSIS — Z125 Encounter for screening for malignant neoplasm of prostate: Secondary | ICD-10-CM | POA: Diagnosis not present

## 2022-07-24 DIAGNOSIS — I10 Essential (primary) hypertension: Secondary | ICD-10-CM | POA: Diagnosis not present

## 2022-07-24 DIAGNOSIS — R739 Hyperglycemia, unspecified: Secondary | ICD-10-CM | POA: Diagnosis not present

## 2022-07-27 DIAGNOSIS — Z0000 Encounter for general adult medical examination without abnormal findings: Secondary | ICD-10-CM | POA: Diagnosis not present

## 2022-07-27 DIAGNOSIS — E7800 Pure hypercholesterolemia, unspecified: Secondary | ICD-10-CM | POA: Diagnosis not present

## 2022-07-27 DIAGNOSIS — I2510 Atherosclerotic heart disease of native coronary artery without angina pectoris: Secondary | ICD-10-CM | POA: Diagnosis not present

## 2022-07-27 DIAGNOSIS — I10 Essential (primary) hypertension: Secondary | ICD-10-CM | POA: Diagnosis not present

## 2022-07-27 DIAGNOSIS — Z6831 Body mass index (BMI) 31.0-31.9, adult: Secondary | ICD-10-CM | POA: Diagnosis not present

## 2022-07-27 DIAGNOSIS — R7303 Prediabetes: Secondary | ICD-10-CM | POA: Diagnosis not present

## 2022-12-07 DIAGNOSIS — H25813 Combined forms of age-related cataract, bilateral: Secondary | ICD-10-CM | POA: Diagnosis not present

## 2022-12-07 DIAGNOSIS — H04123 Dry eye syndrome of bilateral lacrimal glands: Secondary | ICD-10-CM | POA: Diagnosis not present

## 2022-12-07 DIAGNOSIS — H5203 Hypermetropia, bilateral: Secondary | ICD-10-CM | POA: Diagnosis not present

## 2022-12-07 DIAGNOSIS — Z135 Encounter for screening for eye and ear disorders: Secondary | ICD-10-CM | POA: Diagnosis not present

## 2023-03-20 DIAGNOSIS — U071 COVID-19: Secondary | ICD-10-CM | POA: Diagnosis not present

## 2023-03-20 DIAGNOSIS — J029 Acute pharyngitis, unspecified: Secondary | ICD-10-CM | POA: Diagnosis not present

## 2023-07-30 DIAGNOSIS — R7303 Prediabetes: Secondary | ICD-10-CM | POA: Diagnosis not present

## 2023-07-30 DIAGNOSIS — Z125 Encounter for screening for malignant neoplasm of prostate: Secondary | ICD-10-CM | POA: Diagnosis not present

## 2023-07-30 DIAGNOSIS — R748 Abnormal levels of other serum enzymes: Secondary | ICD-10-CM | POA: Diagnosis not present

## 2023-07-30 DIAGNOSIS — E039 Hypothyroidism, unspecified: Secondary | ICD-10-CM | POA: Diagnosis not present

## 2023-07-30 DIAGNOSIS — E7800 Pure hypercholesterolemia, unspecified: Secondary | ICD-10-CM | POA: Diagnosis not present

## 2023-07-30 DIAGNOSIS — I10 Essential (primary) hypertension: Secondary | ICD-10-CM | POA: Diagnosis not present

## 2023-08-02 DIAGNOSIS — Z8249 Family history of ischemic heart disease and other diseases of the circulatory system: Secondary | ICD-10-CM | POA: Diagnosis not present

## 2023-08-02 DIAGNOSIS — Z8739 Personal history of other diseases of the musculoskeletal system and connective tissue: Secondary | ICD-10-CM | POA: Diagnosis not present

## 2023-08-02 DIAGNOSIS — E119 Type 2 diabetes mellitus without complications: Secondary | ICD-10-CM | POA: Diagnosis not present

## 2023-08-02 DIAGNOSIS — E039 Hypothyroidism, unspecified: Secondary | ICD-10-CM | POA: Diagnosis not present

## 2023-08-02 DIAGNOSIS — I10 Essential (primary) hypertension: Secondary | ICD-10-CM | POA: Diagnosis not present

## 2023-08-02 DIAGNOSIS — I2510 Atherosclerotic heart disease of native coronary artery without angina pectoris: Secondary | ICD-10-CM | POA: Diagnosis not present

## 2023-08-02 DIAGNOSIS — Z0000 Encounter for general adult medical examination without abnormal findings: Secondary | ICD-10-CM | POA: Diagnosis not present

## 2023-10-30 DIAGNOSIS — E7800 Pure hypercholesterolemia, unspecified: Secondary | ICD-10-CM | POA: Diagnosis not present

## 2023-10-30 DIAGNOSIS — I2584 Coronary atherosclerosis due to calcified coronary lesion: Secondary | ICD-10-CM | POA: Diagnosis not present

## 2023-10-30 DIAGNOSIS — E1169 Type 2 diabetes mellitus with other specified complication: Secondary | ICD-10-CM | POA: Diagnosis not present

## 2023-10-30 DIAGNOSIS — E119 Type 2 diabetes mellitus without complications: Secondary | ICD-10-CM | POA: Diagnosis not present

## 2023-10-30 DIAGNOSIS — I10 Essential (primary) hypertension: Secondary | ICD-10-CM | POA: Diagnosis not present

## 2023-12-31 DIAGNOSIS — H04123 Dry eye syndrome of bilateral lacrimal glands: Secondary | ICD-10-CM | POA: Diagnosis not present

## 2023-12-31 DIAGNOSIS — H25813 Combined forms of age-related cataract, bilateral: Secondary | ICD-10-CM | POA: Diagnosis not present

## 2023-12-31 DIAGNOSIS — H5203 Hypermetropia, bilateral: Secondary | ICD-10-CM | POA: Diagnosis not present

## 2023-12-31 DIAGNOSIS — Z135 Encounter for screening for eye and ear disorders: Secondary | ICD-10-CM | POA: Diagnosis not present

## 2024-01-31 DIAGNOSIS — I10 Essential (primary) hypertension: Secondary | ICD-10-CM | POA: Diagnosis not present

## 2024-01-31 DIAGNOSIS — E1169 Type 2 diabetes mellitus with other specified complication: Secondary | ICD-10-CM | POA: Diagnosis not present

## 2024-01-31 DIAGNOSIS — E7800 Pure hypercholesterolemia, unspecified: Secondary | ICD-10-CM | POA: Diagnosis not present

## 2024-01-31 DIAGNOSIS — I2584 Coronary atherosclerosis due to calcified coronary lesion: Secondary | ICD-10-CM | POA: Diagnosis not present

## 2024-04-08 ENCOUNTER — Emergency Department (HOSPITAL_BASED_OUTPATIENT_CLINIC_OR_DEPARTMENT_OTHER)
Admission: EM | Admit: 2024-04-08 | Discharge: 2024-04-09 | Disposition: A | Discharge status: Home / Self Care | Attending: Emergency Medicine | Admitting: Emergency Medicine

## 2024-04-08 ENCOUNTER — Encounter (HOSPITAL_BASED_OUTPATIENT_CLINIC_OR_DEPARTMENT_OTHER): Payer: Self-pay | Admitting: Emergency Medicine

## 2024-04-08 ENCOUNTER — Other Ambulatory Visit: Payer: Self-pay

## 2024-04-08 DIAGNOSIS — R040 Epistaxis: Secondary | ICD-10-CM | POA: Diagnosis not present

## 2024-04-08 DIAGNOSIS — Z7982 Long term (current) use of aspirin: Secondary | ICD-10-CM | POA: Diagnosis not present

## 2024-04-08 DIAGNOSIS — I2510 Atherosclerotic heart disease of native coronary artery without angina pectoris: Secondary | ICD-10-CM | POA: Diagnosis not present

## 2024-04-08 DIAGNOSIS — Z79899 Other long term (current) drug therapy: Secondary | ICD-10-CM | POA: Insufficient documentation

## 2024-04-08 DIAGNOSIS — I10 Essential (primary) hypertension: Secondary | ICD-10-CM | POA: Diagnosis not present

## 2024-04-08 NOTE — ED Provider Notes (Signed)
 [facility] Provider Note   CSN: [id number] Arrival date & time: 04/08/24  2222     Patient presents with: Epistaxis   Chase Sullivan is a 73 y.o. male with past medical history significant for hypertension, CAD who takes 81 mg aspirin at who presents with concern for 5 nosebleeds over the last 2 to 3 weeks.  Had difficulty getting nose to stop bleeding tonight after multiple attempts.  Denies any headache, chest pain, shortness of breath, fatigue, weakness.  Denies lightheadedness.  No history of nasal trauma.    Epistaxis      Prior to Admission medications   Medication Sig Start Date End Date Taking? Authorizing Provider  aspirin 81 MG chewable tablet Chew by mouth daily.    [provider]  atenolol (TENORMIN) 50 MG tablet Take 50 mg by mouth daily.    [provider]  atenolol-chlorthalidone (TENORETIC) 50-25 MG tablet Take 1 tablet by mouth daily. Patient not taking: Reported on 08/02/2020    [provider]  atorvastatin (LIPITOR) 10 MG tablet Take 20 mg by mouth daily.    [provider]  calcium citrate-vitamin D (CITRACAL+D) 315-200 MG-UNIT tablet Take 1 tablet by mouth 2 (two) times daily.    [provider]  cholecalciferol (VITAMIN D3) 25 MCG (1000 UNIT) tablet Take 2,000 Units by mouth daily.    [provider]  meclizine  (ANTIVERT ) 25 MG tablet Take 1 tablet (25 mg total) by mouth 3 (three) times daily as needed for dizziness. Patient not taking: Reported on 08/02/2020 03/18/17   Geroldine Berg, MD  Multiple Vitamin (MULTIVITAMIN) capsule Take 1 capsule by mouth daily.    [provider]    Allergies: Chlorthalidone and Codeine    Review of Systems  HENT:  Positive for nosebleeds.   All other systems reviewed and are negative.   Updated Vital Signs BP 131/83   Pulse 88   Temp 98 F (36.7 C)   Resp 18   Ht 5' 10 (1.778 m)   Wt 99.3 kg   SpO2 100%    BMI 31.42 kg/m   Physical Exam Vitals and nursing note reviewed.  Constitutional:      General: He is not in acute distress.    Appearance: Normal appearance.  HENT:     Head: Normocephalic and atraumatic.     Nose:     Comments: Large clot at anterior left nare removed at bedside and revealed an appropriately clotted spot at [facility] plexus. Bleeding resolved on recheck. Eyes:     General:        Right eye: No discharge.        Left eye: No discharge.  Cardiovascular:     Rate and Rhythm: Normal rate and regular rhythm.  Pulmonary:     Effort: Pulmonary effort is normal. No respiratory distress.  Musculoskeletal:        General: No deformity.  Skin:    General: Skin is warm and dry.  Neurological:     Mental Status: He is alert and oriented to person, place, and time.  Psychiatric:        Mood and Affect: Mood normal.        Behavior: Behavior normal.     (all labs ordered are listed, but only abnormal results are displayed) Labs Reviewed - No data to display  EKG: None  Radiology: No results found.   Procedures   Medications Ordered in the ED - No  data to display                                  Medical Decision Making  This patient is a 73 y.o. male who presents to the ED for concern of epistaxis.   Differential diagnoses prior to evaluation: Anterior / posterior epistaxis, coag disorder, concurrent anemia, versus other  Past Medical History / Social History / Additional history: Chart reviewed. Pertinent results include: Hypertension, CAD  Physical Exam: Physical exam performed. The pertinent findings include: Large clot at anterior left nare removed at bedside and revealed an appropriately clotted spot at [facility] plexus. Bleeding resolved on recheck.  Initially with mild hypertension, blood pressure 150/104, resolved on recheck.  Vital signs otherwise stable.  Medications / Treatment: Instructed on appropriate pressure holding techniques  to prevent return of nosebleeds in the future, instructed to swab inside of nare with Vaseline 3-4 times daily.  Discussed return precautions.   Disposition: After consideration of the diagnostic results and the patients response to treatment, I feel that patient with simple anterior epistaxis, resolved on reevaluation.  Stable for discharge with plan as above  emergency department workup does not suggest an emergent condition requiring admission or immediate intervention beyond what has been performed at this time. The plan is: as above. The patient is safe for discharge and has been instructed to return immediately for worsening symptoms, change in symptoms or any other concerns.   Final diagnoses:  Anterior epistaxis    ED Discharge Orders     None          Rosan Sherlean VEAR DEVONNA 04/09/24 0002    Roselyn Carlin NOVAK, MD 04/09/24 [phone]

## 2024-04-08 NOTE — Discharge Instructions (Signed)
 As we discussed I saw a normal appearing nose with scab at a location that is very common for nosebleeds.  What is probably happening is that he has a scab that is forming that comes off and leads to return of bleeding in the same location.  I recommend using some Vaseline or Aquaphor on a Q-tip, and swabbing inside the nostril 3-4 times a day to help prevent return of nosebleeds.  You do have return of nosebleed pinch firmly at the bridge of the nose for 10 to 15 minutes without checking.  If you have a feeling of blood pouring down the back of your throat, or you cannot get the nosebleed to stop after pressure as discussed above please return for further evaluation.

## 2024-04-08 NOTE — ED Triage Notes (Signed)
 Pt c/o nose bleed to left nostril x 5 times of 3 weeks, unable to get nose to stop bleeding tonight, not on thinners

## 2024-04-09 DIAGNOSIS — J3489 Other specified disorders of nose and nasal sinuses: Secondary | ICD-10-CM | POA: Diagnosis not present

## 2024-04-09 DIAGNOSIS — R040 Epistaxis: Secondary | ICD-10-CM | POA: Diagnosis not present

## 2024-05-02 DIAGNOSIS — I2584 Coronary atherosclerosis due to calcified coronary lesion: Secondary | ICD-10-CM | POA: Diagnosis not present

## 2024-05-02 DIAGNOSIS — E1169 Type 2 diabetes mellitus with other specified complication: Secondary | ICD-10-CM | POA: Diagnosis not present

## 2024-05-02 DIAGNOSIS — E7800 Pure hypercholesterolemia, unspecified: Secondary | ICD-10-CM | POA: Diagnosis not present

## 2024-05-02 DIAGNOSIS — I10 Essential (primary) hypertension: Secondary | ICD-10-CM | POA: Diagnosis not present

## 2024-07-03 DIAGNOSIS — Z1211 Encounter for screening for malignant neoplasm of colon: Secondary | ICD-10-CM | POA: Diagnosis not present

## 2024-07-03 DIAGNOSIS — K648 Other hemorrhoids: Secondary | ICD-10-CM | POA: Diagnosis not present

## 2024-07-03 DIAGNOSIS — K644 Residual hemorrhoidal skin tags: Secondary | ICD-10-CM | POA: Diagnosis not present

## 2024-07-03 DIAGNOSIS — K5730 Diverticulosis of large intestine without perforation or abscess without bleeding: Secondary | ICD-10-CM | POA: Diagnosis not present

## 2024-08-22 ENCOUNTER — Ambulatory Visit: Attending: Cardiology | Admitting: Cardiology

## 2024-08-22 ENCOUNTER — Encounter: Payer: Self-pay | Admitting: Cardiology

## 2024-08-22 VITALS — BP 116/72 | HR 67 | Ht 70.0 in | Wt 208.8 lb

## 2024-08-22 DIAGNOSIS — R9431 Abnormal electrocardiogram [ECG] [EKG]: Secondary | ICD-10-CM | POA: Diagnosis not present

## 2024-08-22 DIAGNOSIS — I4891 Unspecified atrial fibrillation: Secondary | ICD-10-CM | POA: Diagnosis not present

## 2024-08-22 NOTE — Progress Notes (Signed)
 " Cardiology Office Note:  .   Date:  08/22/2024  ID:  [name], DOB [birth year], MRN [id number] PCP: Clarice Nottingham, MD  [facility] Providers Cardiologist:  None     History of Present Illness: .   Chase Sullivan is a 74 y.o. male Discussed the use of AI scribe History of Present Illness Chase Sullivan is a 75 year old male with EKG done yesterday at primary care's office, Dr. Nottingham Clarice which showed atrial fibrillation here for evaluation.  He had the EKG done during a routine physical.  Slightly irregular heartbeat heard on exam.  No symptoms of heart skipping, chest pain, or shortness of breath.   He has a history of hyperlipidemia and is on atorvastatin 20 mg daily and Zetia 10 mg daily. He also takes atenolol 50 mg daily. His coronary calcium score was 58 in the past, and recent labs show a score of 59, which is in the 39th percentile. His LP(a) is elevated at 93.  He has type 2 diabetes with an A1c of 7.0. He mentions his A1c has fluctuated in the past, reaching as low as 5.9, depending on his diet. No history of heart attacks or strokes.  Non-smoker.         Studies Reviewed: SABRA   EKG Interpretation Date/Time:  Friday August 22 2024 15:28:20 EST Ventricular Rate:  67 PR Interval:  146 QRS Duration:  80 QT Interval:  374 QTC Calculation: 395 R Axis:   55  Text Interpretation: Sinus rhythm with Premature atrial complexes When compared with ECG of 18-Mar-2017 10:55, Premature atrial complexes are now Present Confirmed by Jeffrie Anes ([id number]) on 08/22/2024 3:33:24 PM    Results Labs Creatinine: 1.05 ALT: 37 Hemoglobin: 13.9 Lp(a): 93, elevated A1c: 7.0  Radiology Coronary calcium score: 59, 39th percentile; previously 58  Diagnostic EKG (08/21/2024): Artifact with baseline noise; P wave activity preceding QRS complexes in cleaner segments; sinus rhythm with premature atrial contractions; no atrial fibrillation (Independently interpreted) EKG  (08/22/2024): Sinus rhythm with premature atrial contractions (Independently interpreted) EKG (2018): Normal sinus rhythm, sinus arrhythmia, no atrial fibrillation (Independently interpreted) Risk Assessment/Calculations:            Physical Exam:   VS:  BP 116/72   Pulse 67   Ht 5' 10 (1.778 m)   Wt 208 lb 12.8 oz (94.7 kg)   SpO2 95%   BMI 29.96 kg/m    Wt Readings from Last 3 Encounters:  08/22/24 208 lb 12.8 oz (94.7 kg)  04/08/24 219 lb (99.3 kg)  09/15/20 219 lb 3.2 oz (99.4 kg)    GEN: Well nourished, well developed in no acute distress NECK: No JVD; No carotid bruits CARDIAC: Occasional ectopy RRR, no murmurs, no rubs, no gallops RESPIRATORY:  Clear to auscultation without rales, wheezing or rhonchi  ABDOMEN: Soft, non-tender, non-distended EXTREMITIES:  No edema; No deformity   ASSESSMENT AND PLAN: .    Assessment and Plan Assessment & Plan Evaluation of possible atrial fibrillation Initial EKG from outside office suggested atrial fibrillation, but current EKG shows sinus rhythm with PACs. Artifact and noise on the initial EKG may have led to a false atrial fibrillation reading. No symptoms of atrial fibrillation reported.  I seeked out a few other cardiologist here in the office and outside EKG does look like sinus rhythm with PACs with baseline artifact. -Because of this, he may stop his Eliquis. -Current diagnosis is sinus rhythm with premature atrial contractions.  Coronary artery  calcification Coronary calcium score of 59, which is in the 39th percentile. No acute coronary syndrome symptoms reported. - Continue current management with atorvastatin and Zetia for cholesterol control.  Excellent.  Hyperlipidemia Managed with atorvastatin 20 mg daily and Zetia 10 mg daily. LDL goal is less than 70 mg/dL. - Continue atorvastatin 20 mg daily. - Continue Zetia 10 mg daily.  Type 2 diabetes mellitus A1c of 7.0, indicating suboptimal control. Previous A1c levels  have been as low as 5.9. Blood glucose levels are slightly elevated. - Continue to focus on better dietary control to improve A1c levels.         Dispo: Please let us  know if we can be of further assistance.  Signed, Oneil Parchment, MD  "

## 2024-08-22 NOTE — Patient Instructions (Signed)
 Medication Instructions:  The current medical regimen is effective;  continue present plan and medications.  *If you need a refill on your cardiac medications before your next appointment, please call your pharmacy*  Follow-Up: At [facility], you and your health needs are our priority.  As part of our continuing mission to provide you with exceptional heart care, our providers are all part of one team.  This team includes your primary Cardiologist (physician) and Advanced Practice Providers or APPs (Physician Assistants and Nurse Practitioners) who all work together to provide you with the care you need, when you need it.  Your next appointment:   Follow up as needed.
